# Patient Record
Sex: Female | Born: 2000 | Race: Black or African American | Hispanic: No | Marital: Single | Smoking: Never smoker
Health system: Southern US, Community
[De-identification: ages and names within clinical notes are randomized; demographics above are authoritative.]

## PROBLEM LIST (undated history)

## (undated) DIAGNOSIS — F819 Developmental disorder of scholastic skills, unspecified: Secondary | ICD-10-CM

---
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
## 2021-01-19 ENCOUNTER — Ambulatory Visit: Payer: Medicaid Other

## 2021-02-14 ENCOUNTER — Ambulatory Visit: Payer: Medicaid Other | Admitting: Advanced Practice Midwife

## 2021-03-02 ENCOUNTER — Ambulatory Visit: Payer: Medicaid Other | Admitting: Advanced Practice Midwife

## 2021-03-20 ENCOUNTER — Other Ambulatory Visit: Payer: Self-pay

## 2021-03-20 ENCOUNTER — Other Ambulatory Visit (HOSPITAL_COMMUNITY)
Admission: RE | Admit: 2021-03-20 | Discharge: 2021-03-20 | Disposition: A | Discharge status: Home / Self Care | Payer: Medicaid Other | Source: Ambulatory Visit

## 2021-03-20 ENCOUNTER — Encounter: Payer: Self-pay | Admitting: Obstetrics and Gynecology

## 2021-03-20 ENCOUNTER — Ambulatory Visit (INDEPENDENT_AMBULATORY_CARE_PROVIDER_SITE_OTHER): Payer: Medicaid Other | Admitting: Obstetrics and Gynecology

## 2021-03-20 VITALS — BP 121/76 | HR 77 | Wt 292.0 lb

## 2021-03-20 DIAGNOSIS — Z01419 Encounter for gynecological examination (general) (routine) without abnormal findings: Secondary | ICD-10-CM | POA: Diagnosis present

## 2021-03-20 MED ORDER — BALCOLTRA 0.1-20 MG-MCG(21) PO TABS: 1.0000 | ORAL_TABLET | Freq: Every day | ORAL | 12 refills | Status: AC

## 2021-03-20 NOTE — Patient Instructions (Signed)
Polycystic Ovary Syndrome  Polycystic ovarian syndrome (PCOS) is a common hormonal disorder among women of reproductive age. In most women with PCOS, small fluid-filled sacs (cysts) grow on the ovaries. PCOS can cause problems with menstrual periods and make it hard to get and stay pregnant. If this condition is not treated, it can lead to serious health problems, such as diabetes and heart disease. What are the causes? The cause of this condition is not known. It may be due to certain factors, such as:  Irregular menstrual cycle.  High levels of certain hormones.  Problems with the hormone that helps to control blood sugar (insulin).  Certain genes. What increases the risk? You are more likely to develop this condition if you:  Have a family history of PCOS or type 2 diabetes.  Are overweight, eat unhealthy foods, and are not active. These factors may cause problems with blood sugar control, which can contribute to PCOS or PCOS symptoms. What are the signs or symptoms? Symptoms of this condition include:  Ovarian cysts and sometimes pelvic pain.  Menstrual periods that are not regular or are too heavy.  Inability to get or stay pregnant.  Increased growth of hair on the face, chest, stomach, back, thumbs, thighs, or toes.  Acne or oily skin. Acne may develop during adulthood, and it may not get better with treatment.  Weight gain or obesity.  Patches of thickened and dark brown or black skin on the neck, arms, breasts, or thighs. How is this diagnosed? This condition is diagnosed based on:  Your medical history.  A physical exam that includes a pelvic exam. Your health care provider may look for areas of increased hair growth on your skin.  Tests, such as: ? An ultrasound to check the ovaries for cysts and to view the lining of the uterus. ? Blood tests to check levels of sugar (glucose), female hormone (testosterone), and female hormones (estrogen and progesterone). How  is this treated? There is no cure for this condition, but treatment can help to manage symptoms and prevent more health problems from developing. Treatment varies depending on your symptoms and if you want to have a baby or if you need birth control. Treatment may include:  Making nutrition and lifestyle changes.  Taking the progesterone hormone to start a menstrual period.  Taking birth control pills to help you have regular menstrual periods.  Taking medicines such as: ? Medicines to make you ovulate, if you want to get pregnant. ? Medicine to reduce extra hair growth.  Having surgery in severe cases. This may involve making small holes in one or both of your ovaries. This decreases the amount of testosterone that your body makes. Follow these instructions at home:  Take over-the-counter and prescription medicines only as told by your health care provider.  Follow a healthy meal plan that includes lean proteins, complex carbohydrates, fresh fruits and vegetables, low-fat dairy products, healthy fats, and fiber.  If you are overweight, lose weight as told by your health care provider. Your health care provider can determine how much weight loss is best for you and can help you lose weight safely.  Keep all follow-up visits. This is important. Contact a health care provider if:  Your symptoms do not get better with medicine.  Your symptoms get worse or you develop new symptoms. Summary  Polycystic ovarian syndrome (PCOS) is a common hormonal disorder among women of reproductive age.  PCOS can cause problems with menstrual periods and make it hard   to get and stay pregnant.  If this condition is not treated, it can lead to serious health problems, such as diabetes and heart disease.  There is no cure for this condition, but treatment can help to manage symptoms and prevent more health problems from developing. This information is not intended to replace advice given to you by your  health care provider. Make sure you discuss any questions you have with your health care provider. Document Revised: 04/14/2020 Document Reviewed: 04/14/2020 Elsevier Patient Education  2021 Elsevier Inc.    Diet for Polycystic Ovary Syndrome Polycystic ovary syndrome (PCOS) is a common hormonal disorder that affects a woman's reproductive system. It can cause problems with menstrual periods and make it hard to get and stay pregnant. Changing what you eat can help your hormones reach normal levels, improve your health, and help you better manage PCOS. Following a balanced diet can help you lose weight and improve the way that your body uses the hormone insulin to control blood sugar. This may include:  Eating low-fat (lean) proteins, complex carbohydrates, fresh fruits and vegetables, low-fat dairy products, healthy fats, and fiber.  Cutting down on calories.  Exercising regularly. What are tips for following this plan?  Follow a balanced diet for meals and snacks. Eat breakfast, lunch, dinner, and one or two snacks every day.  Include protein in each meal and snack.  Choose whole grains instead of products that are made with refined flour.  Eat a variety of foods.  Exercise regularly as told by your health care provider. Aim to do at least 30 minutes of exercise on most days of the week.  If you are overweight or obese: ? Pay attention to how many calories you eat. Cutting down on calories can help you lose weight. ? Work with your health care provider or a dietitian to figure out how many calories you need each day. What foods should I eat? Fruits Include a variety of colors and types. All fruits are helpful for PCOS. Vegetables Include a variety of colors and types. All vegetables are helpful for PCOS. Grains Whole grains, such as whole wheat. Whole-grain breads, crackers, cereals, and pasta. Unsweetened oatmeal. Bulgur, barley, quinoa, and brown rice. Tortillas made from corn  or whole-wheat flour. Meats and other proteins Lean proteins, such as fish, chicken, beans, eggs, and tofu. Dairy Low-fat dairy products, such as skim milk, cheese sticks, and yogurt. Beverages Low-fat or fat-free drinks, such as water, low-fat milk, sugar-free drinks, and small amounts of 100% fruit juice. Seasonings and condiments Ketchup. Mustard. Barbecue sauce. Relish. Low-fat or fat-free mayonnaise. Fats and oils Olive oil or canola oil. Walnuts and almonds. The items listed above may not be a complete list of recommended foods and beverages. Contact a dietitian for more options.   What foods should I avoid? Foods that are high in calories or fat, especially saturated or trans fats. Fried foods. Sweets. Products that are made from refined white flour, including white bread, pastries, white rice, and pasta. The items listed above may not be a complete list of foods and beverages to avoid. Contact a dietitian for more information. Summary  PCOS is a hormonal imbalance that affects a woman's reproductive system. It can cause problems with menstrual periods and make it hard to get and stay pregnant.  You can help to manage your PCOS by exercising regularly and eating a healthy, varied diet of vegetables, fruit, whole grains, lean protein, and low-fat dairy products.  Changing what you eat   can improve the way that your body uses insulin, help your hormones reach normal levels, and help you lose weight. This information is not intended to replace advice given to you by your health care provider. Make sure you discuss any questions you have with your health care provider. Document Revised: 04/14/2020 Document Reviewed: 04/14/2020 Elsevier Patient Education  2021 Elsevier Inc.    

## 2021-03-20 NOTE — Progress Notes (Signed)
NGYN presents for annual.  Pt reports not having monthly cycles and when she does have a cycle, they are heavy and she bleeds for a month.  Pt also c/o vaginal irritation.

## 2021-03-20 NOTE — Progress Notes (Signed)
  Subjective:     Diana Jenkins is a 20 y.o. female P0 with LMP 01/02/21 and BMI 55 who is here for a comprehensive physical exam. The patient reports menorrhagia. Patient reports a monthly period lasting 10 days and occasionally will have vaginal bleeding lasting a month and a half. Patient admits to facial hair which she shaves and chest hair. Patient has never been sexually active. She denies any pelvic pain or abnormal discharge. She admits to vaginal pruritis and is concerned she may have a yeast infection.  Past Medical History:  Diagnosis Date  . 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 No pertinent surgical history. Family History  Problem Relation Age of Onset  . Diabetes Mother   . Hypertension Mother   . Hypertension Father   . Hypertension Maternal Grandmother   . Lung cancer Maternal Grandfather     Social History   Socioeconomic History  . Marital status: Single    Spouse name: Not on file  . Number of children: Not on file  . Years of education: Not on file  . Highest education level: Not on file  Occupational History  . Not on file  Tobacco Use  . Smoking status: Never Smoker  . Smokeless tobacco: Never Used  Vaping Use  . Vaping Use: Never used  Substance and Sexual Activity  . Alcohol use: No  . Drug use: Never  . Sexual activity: Not on file  Other Topics Concern  . Not on file  Social History Narrative  . Not on file   Social Determinants of Health   Financial Resource Strain: Not on file  Food Insecurity: Not on file  Transportation Needs: Not on file  Physical Activity: Not on file  Stress: Not on file  Social Connections: Not on file  Intimate Partner Violence: Not on file   Health Maintenance  Topic Date Due  . Hepatitis C Screening  Never done  . COVID-19 Vaccine (1) Never done  . HPV VACCINES (1 - 2-dose series) Never done  . HIV Screening  Never done  . TETANUS/TDAP  Never done  . PAP-Cervical Cytology  Screening  02/06/2021  . PAP SMEAR-Modifier  02/06/2021  . INFLUENZA VACCINE  06/19/2021       Review of Systems Pertinent items noted in HPI and remainder of comprehensive ROS otherwise negative.   Objective:  Blood pressure 121/76, pulse 77, weight 292 lb (132.5 kg), last menstrual period 01/02/2021.     GENERAL: Well-developed, well-nourished female in no acute distress.  HEENT: Normocephalic, atraumatic. Sclerae anicteric.  NECK: Supple. Normal thyroid.  LUNGS: Clear to auscultation bilaterally.  HEART: Regular rate and rhythm. BREASTS: Symmetric in size. No palpable masses or lymphadenopathy, skin changes, or nipple drainage. ABDOMEN: Soft, nontender, nondistended. No organomegaly. PELVIC: Normal external female genitalia. Vagina is pink and rugated.  Normal discharge. Normal appearing cervix. Uterus is normal in size.  No adnexal mass or tenderness. EXTREMITIES: No cyanosis, clubbing, or edema, 2+ distal pulses.    Assessment:    Healthy female exam.      Plan:    Pap smear collected Patient declined STI testing Vaginal swab collected to rule out yeast infection Patient will be contacted with abnormal results Discussed possible diagnosis of PCOS- information provided Discussed weight loss to help regulate cycles Discussed contraception for medical management- patient agreed. Rx balcotra provided. Patient advised to use for at least 3 months RTC prn See After Visit Summary for Counseling Recommendations

## 2021-03-21 LAB — CYTOLOGY - PAP: Diagnosis: NEGATIVE

## 2021-03-21 LAB — CERVICOVAGINAL ANCILLARY ONLY
Bacterial Vaginitis (gardnerella): NEGATIVE
Candida Glabrata: NEGATIVE
Candida Vaginitis: NEGATIVE
Comment: NEGATIVE
Comment: NEGATIVE
Comment: NEGATIVE

## 2022-10-23 ENCOUNTER — Other Ambulatory Visit: Payer: Self-pay | Admitting: Internal Medicine

## 2022-10-30 LAB — COMPLETE METABOLIC PANEL WITH GFR
AG Ratio: 1.3 (calc) (ref 1.0–2.5)
ALT: 15 U/L (ref 6–29)
AST: 17 U/L (ref 10–30)
Albumin: 3.8 g/dL (ref 3.6–5.1)
Alkaline phosphatase (APISO): 76 U/L (ref 31–125)
BUN: 13 mg/dL (ref 7–25)
CO2: 26 mmol/L (ref 20–32)
Calcium: 9.1 mg/dL (ref 8.6–10.2)
Chloride: 105 mmol/L (ref 98–110)
Creat: 0.95 mg/dL (ref 0.50–0.96)
Globulin: 3 g/dL (calc) (ref 1.9–3.7)
Glucose, Bld: 89 mg/dL (ref 65–99)
Potassium: 4.1 mmol/L (ref 3.5–5.3)
Sodium: 140 mmol/L (ref 135–146)
Total Bilirubin: 0.3 mg/dL (ref 0.2–1.2)
Total Protein: 6.8 g/dL (ref 6.1–8.1)
eGFR: 87 mL/min/{1.73_m2} (ref >=60)

## 2022-10-30 LAB — CBC
HCT: 30.3 % — ABNORMAL LOW (ref 35.0–45.0)
Hemoglobin: 9.9 g/dL — ABNORMAL LOW (ref 11.7–15.5)
MCH: 26.6 pg — ABNORMAL LOW (ref 27.0–33.0)
MCHC: 32.7 g/dL (ref 32.0–36.0)
MCV: 81.5 fL (ref 80.0–100.0)
MPV: 9.5 fL (ref 7.5–12.5)
Platelets: 392 10*3/uL (ref 140–400)
RBC: 3.72 10*6/uL — ABNORMAL LOW (ref 3.80–5.10)
RDW: 14.1 % (ref 11.0–15.0)
WBC: 5.3 10*3/uL (ref 3.8–10.8)

## 2022-10-30 LAB — LIPID PANEL
Cholesterol: 134 mg/dL (ref <200)
HDL: 39 mg/dL — ABNORMAL LOW (ref >=50)
LDL Cholesterol (Calc): 80 mg/dL (calc)
Non-HDL Cholesterol (Calc): 95 mg/dL (calc) (ref <130)
Total CHOL/HDL Ratio: 3.4 (calc) (ref <5.0)
Triglycerides: 71 mg/dL (ref <150)

## 2022-10-30 LAB — B12 AND FOLATE PANEL
Folate: 7.1 ng/mL
Vitamin B-12: 556 pg/mL (ref 200–1100)

## 2022-10-30 LAB — IRON, TOTAL/TOTAL IRON BINDING CAP
%SAT: 7 % (calc) — ABNORMAL LOW (ref 16–45)
Iron: 21 ug/dL — ABNORMAL LOW (ref 40–190)
TIBC: 300 mcg/dL (calc) (ref 250–450)

## 2022-10-30 LAB — TSH: TSH: 3.22 mIU/L

## 2022-10-30 LAB — VITAMIN D 25 HYDROXY (VIT D DEFICIENCY, FRACTURES): Vit D, 25-Hydroxy: 8 ng/mL — ABNORMAL LOW (ref 30–100)

## 2022-10-30 LAB — FERRITIN: Ferritin: 19 ng/mL (ref 16–154)
